# Patient Record
Sex: Female | Born: 1950
Health system: Southern US, Community
[De-identification: ages and names within clinical notes are randomized; demographics above are authoritative.]

## PROBLEM LIST (undated history)

## (undated) DIAGNOSIS — H409 Unspecified glaucoma: Secondary | ICD-10-CM

## (undated) DIAGNOSIS — Z889 Allergy status to unspecified drugs, medicaments and biological substances status: Secondary | ICD-10-CM

## (undated) DIAGNOSIS — Z87442 Personal history of urinary calculi: Secondary | ICD-10-CM

## (undated) DIAGNOSIS — F419 Anxiety disorder, unspecified: Secondary | ICD-10-CM

## (undated) DIAGNOSIS — R51 Headache: Secondary | ICD-10-CM

## (undated) DIAGNOSIS — I10 Essential (primary) hypertension: Secondary | ICD-10-CM

## (undated) HISTORY — PX: EYE SURGERY: SHX253

## (undated) HISTORY — PX: CYSTOSCOPY W/ URETERAL STENT PLACEMENT: SHX1429

## (undated) HISTORY — PX: APPENDECTOMY: SHX54

## (undated) HISTORY — PX: DILATION AND CURETTAGE OF UTERUS: SHX78

---

## 1999-01-04 ENCOUNTER — Other Ambulatory Visit: Admission: RE | Admit: 1999-01-04 | Discharge: 1999-01-04 | Payer: Self-pay | Admitting: *Deleted

## 1999-05-03 ENCOUNTER — Other Ambulatory Visit: Admission: RE | Admit: 1999-05-03 | Discharge: 1999-05-03 | Payer: Self-pay | Admitting: *Deleted

## 1999-05-06 ENCOUNTER — Encounter (INDEPENDENT_AMBULATORY_CARE_PROVIDER_SITE_OTHER): Payer: Self-pay | Admitting: *Deleted

## 1999-05-06 ENCOUNTER — Other Ambulatory Visit: Admission: RE | Admit: 1999-05-06 | Discharge: 1999-05-06 | Payer: Self-pay | Admitting: *Deleted

## 1999-11-18 ENCOUNTER — Other Ambulatory Visit: Admission: RE | Admit: 1999-11-18 | Discharge: 1999-11-18 | Payer: Self-pay | Admitting: *Deleted

## 2000-04-08 ENCOUNTER — Other Ambulatory Visit: Admission: RE | Admit: 2000-04-08 | Discharge: 2000-04-08 | Payer: Self-pay | Admitting: *Deleted

## 2001-01-01 ENCOUNTER — Other Ambulatory Visit: Admission: RE | Admit: 2001-01-01 | Discharge: 2001-01-01 | Payer: Self-pay | Admitting: *Deleted

## 2001-07-08 ENCOUNTER — Other Ambulatory Visit: Admission: RE | Admit: 2001-07-08 | Discharge: 2001-07-08 | Payer: Self-pay | Admitting: *Deleted

## 2001-08-31 ENCOUNTER — Ambulatory Visit (HOSPITAL_BASED_OUTPATIENT_CLINIC_OR_DEPARTMENT_OTHER): Admission: RE | Admit: 2001-08-31 | Discharge: 2001-08-31 | Payer: Self-pay | Admitting: Orthopedic Surgery

## 2001-11-16 ENCOUNTER — Ambulatory Visit (HOSPITAL_BASED_OUTPATIENT_CLINIC_OR_DEPARTMENT_OTHER): Admission: RE | Admit: 2001-11-16 | Discharge: 2001-11-16 | Payer: Self-pay | Admitting: Orthopedic Surgery

## 2001-12-29 ENCOUNTER — Other Ambulatory Visit: Admission: RE | Admit: 2001-12-29 | Discharge: 2001-12-29 | Payer: Self-pay | Admitting: *Deleted

## 2003-01-04 ENCOUNTER — Other Ambulatory Visit: Admission: RE | Admit: 2003-01-04 | Discharge: 2003-01-04 | Payer: Self-pay | Admitting: *Deleted

## 2003-06-20 ENCOUNTER — Encounter: Payer: Self-pay | Admitting: Internal Medicine

## 2003-06-20 ENCOUNTER — Encounter: Admission: RE | Admit: 2003-06-20 | Discharge: 2003-06-20 | Payer: Self-pay | Admitting: Internal Medicine

## 2003-06-27 ENCOUNTER — Encounter: Payer: Self-pay | Admitting: Internal Medicine

## 2003-06-27 ENCOUNTER — Encounter: Admission: RE | Admit: 2003-06-27 | Discharge: 2003-06-27 | Payer: Self-pay | Admitting: Internal Medicine

## 2004-01-11 ENCOUNTER — Other Ambulatory Visit: Admission: RE | Admit: 2004-01-11 | Discharge: 2004-01-11 | Payer: Self-pay | Admitting: Family Medicine

## 2005-01-16 ENCOUNTER — Other Ambulatory Visit: Admission: RE | Admit: 2005-01-16 | Discharge: 2005-01-16 | Payer: Self-pay | Admitting: *Deleted

## 2005-01-17 ENCOUNTER — Ambulatory Visit (HOSPITAL_BASED_OUTPATIENT_CLINIC_OR_DEPARTMENT_OTHER): Admission: RE | Admit: 2005-01-17 | Discharge: 2005-01-17 | Payer: Self-pay | Admitting: Orthopedic Surgery

## 2005-01-17 ENCOUNTER — Ambulatory Visit (HOSPITAL_COMMUNITY): Admission: RE | Admit: 2005-01-17 | Discharge: 2005-01-17 | Payer: Self-pay | Admitting: Orthopedic Surgery

## 2007-09-28 ENCOUNTER — Other Ambulatory Visit: Admission: RE | Admit: 2007-09-28 | Discharge: 2007-09-28 | Payer: Self-pay | Admitting: Obstetrics and Gynecology

## 2010-05-23 ENCOUNTER — Other Ambulatory Visit: Admission: RE | Admit: 2010-05-23 | Discharge: 2010-05-23 | Payer: Self-pay | Admitting: Obstetrics and Gynecology

## 2011-03-14 NOTE — Op Note (Signed)
Tara Bowman, Tara Bowman                ACCOUNT NO.:  [id number]   MEDICAL RECORD NO.:  [id number]          PATIENT TYPE:  AMB   LOCATION:  DSC                          FACILITY:  [facility]   PHYSICIAN:  Cindee Salt, M.D.       DATE OF BIRTH:  03-Jul-1951   DATE OF PROCEDURE:  01/17/2005  DATE OF DISCHARGE:                                 OPERATIVE REPORT   PREOPERATIVE DIAGNOSIS:  Carpal tunnel syndrome, left hand.   POSTOPERATIVE DIAGNOSIS:  Carpal tunnel syndrome, left hand.   OPERATION:  Decompression of left median nerve.   SURGEON:  Cindee Salt, M.D.   ASSISTANT:  Carolyne Fiscal R.N.   ANESTHESIA:  Forearm-based IV regional.   HISTORY:  The patient is a 60 year old female with a history of carpal  tunnel syndrome, EMG and nerve conductions positive, which has not responded  to conservative treatment.   PROCEDURE:  The patient was brought to the operating room where a forearm-  based IV regional anesthetic was carried out without difficulty.  She was  prepped using DuraPrep, supine position, left arm free.  A longitudinal  incision was made in the palm and carried down through subcutaneous tissue.  Bleeders were electrocauterized.  Palmar fascia was split, superficial  palmar arch identified, the flexor tendons to the ring and little finger  were identified.  To the ulnar side of median nerve, the carpal retinaculum  was incised with sharp dissection.  Right-angle and Sewell retractors were  placed between skin and forearm fascia.  The fascia was released for  approximately a centimeter proximal to the wrist crease under direct vision.  The canal was explored and no further lesions were identified.  The area of  compression to the nerve was apparent.  The wound was irrigated.  Skin was  closed with interrupted 5-0 nylon sutures.  A sterile compressive dressing  and splint were applied.  The patient tolerated the procedure well and was  taken to the recovery for observation in satisfactory  condition.   She is discharged home to return to the [facility] [place] in 1 week  on Vicodin and elevation,      GK/MEDQ  D:  01/17/2005  T:  01/18/2005  Job:  [id number]

## 2011-03-14 NOTE — Op Note (Signed)
[facility]  Patient:    Tara Bowman, Tara Bowman Visit Number: [id number] MRN: [id number]          Service Type: DSU Location: [facility] Attending Physician:  Ronne Binning Dictated by:   Nicki Reaper, M.D. Proc. Date: 11/16/01 Admit Date:  11/16/2001                             Operative Report  PREOPERATIVE DIAGNOSIS:  Carpal tunnel syndrome, right hand.  POSTOPERATIVE DIAGNOSIS:  Carpal tunnel syndrome, right hand.  PROCEDURE:  Decompression of right median nerve.  SURGEON:  Nicki Reaper, M.D.  ANESTHESIA:  Forearm-based IV regional.  ANESTHESIOLOGIST:  Maren Beach, M.D.  HISTORY:  The patient is a 60 year old female with a history of carpal tunnel syndrome, EMG/nerve conduction positive, which has not responded to conservative treatment.  DESCRIPTION OF PROCEDURE:  The patient was brought to the operating room, where a forearm-based IV regional anesthetic was carried out without difficulty.  She was prepped and draped using Betadine scrub and solution with the right arm free, and a longitudinal incision was made in the palm, carried down through subcutaneous tissue.  Bleeders were electrocauterzied.  Palmar fascia was split, the superficial palmar arch identified, the flexor tendons to the ring and little finger identified to the ulnar side of the median nerve.  The carpal retinaculum was incised with sharp dissection.  A right angle and Sewell retractor were placed between skin and forearm fascia.  The fascia was released for approximatley 3 cm proximal to the wrist crease under direct vision.  The canal was explored, and no further lesions were identified.  The wound was irrigated and the skin was closed with interrupted 5-0 nylon suture.  A sterile compressive dressing and splint were applied. The patient tolerated the procedure well and was taken to the recovery room for observation in satisfactory condition.  She is discharged  home, to return to the [facility] [place] in one week, on Vicodin and Keflex. Dictated by:   Nicki Reaper, M.D. Attending Physician:  Ronne Binning DD:  11/16/01 TD:  11/17/01 Job: [id number]

## 2011-05-14 ENCOUNTER — Other Ambulatory Visit: Payer: Self-pay | Admitting: Otolaryngology

## 2011-05-14 ENCOUNTER — Ambulatory Visit
Admission: RE | Admit: 2011-05-14 | Discharge: 2011-05-14 | Disposition: A | Discharge status: Home / Self Care | Payer: 59 | Source: Ambulatory Visit | Attending: Otolaryngology | Admitting: Otolaryngology

## 2012-08-05 IMAGING — CT CT PARANASAL SINUSES LIMITED
1 series · 8 of 10 positions shown, 10 images · non-contrast
Comparison: None.

CLINICAL DATA: Chronic sinusitis with facial pressure for 6 months.

CT PARANASAL SINUSES WITHOUT CONTRAST
TECHNIQUE: Multidetector CT through the paranasal sinuses was
performed using the standard protocol without intravenous contrast.

[Series 3: cor soft · axial · 0.35mm/px · z∈[+38,+108]mm · 8 of 10 slices shown, 10 images]
[im 2/10  brain]
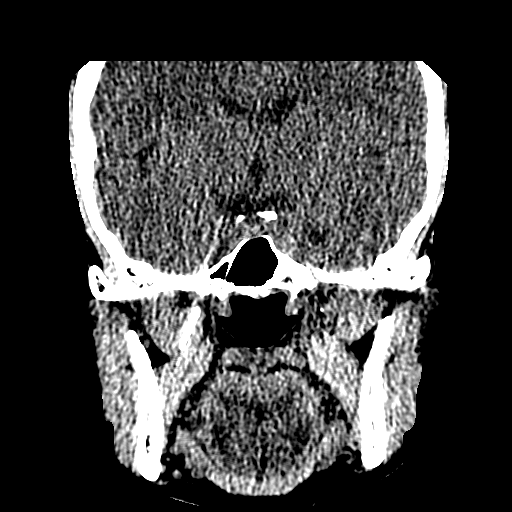
[im 2/10  bone]
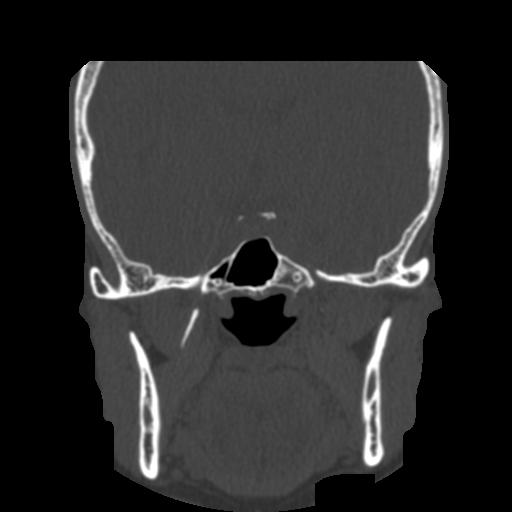
[im 3/10  bone]
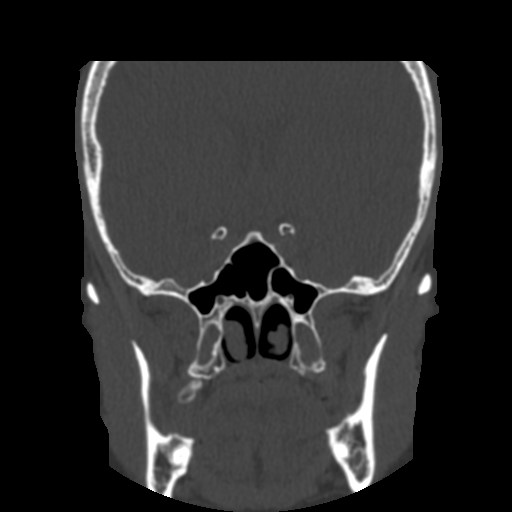
[im 4/10  bone]
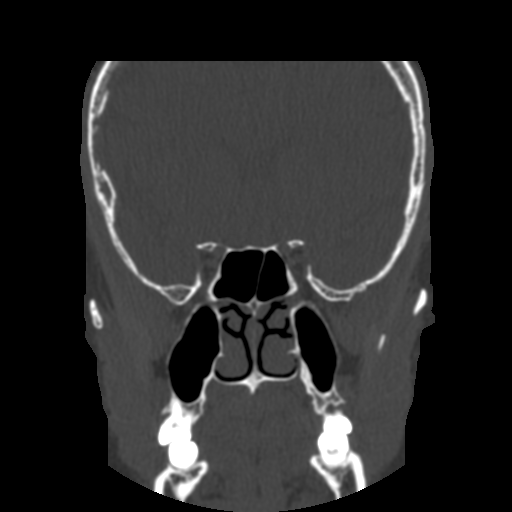
[im 5/10  bone]
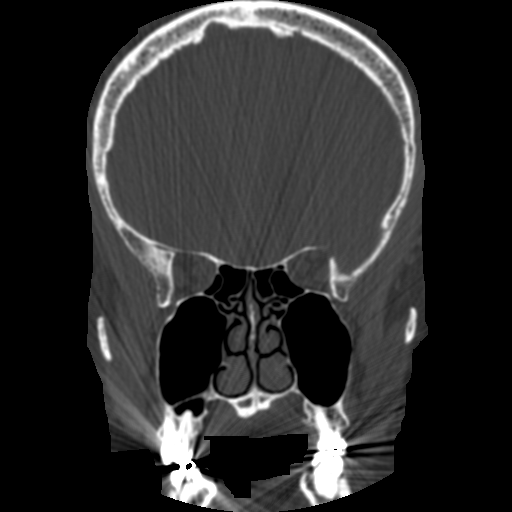
[im 6/10  brain]
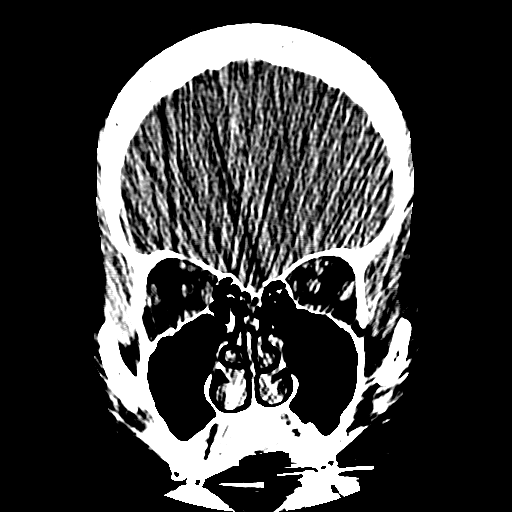
[im 6/10  bone]
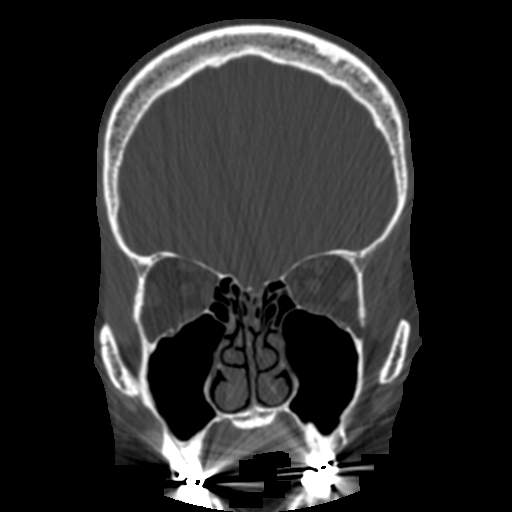
[im 7/10  bone]
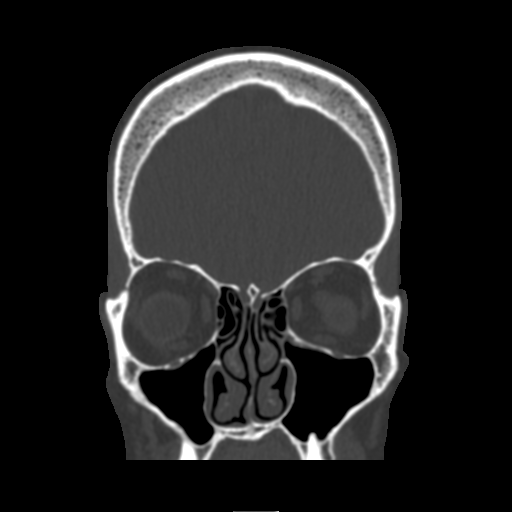
[im 8/10  bone]
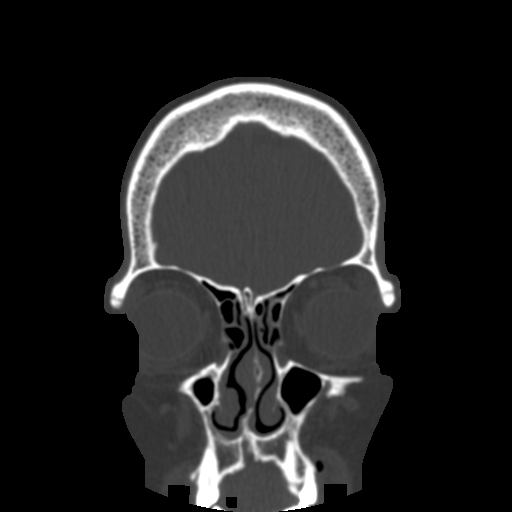
[im 9/10  bone]
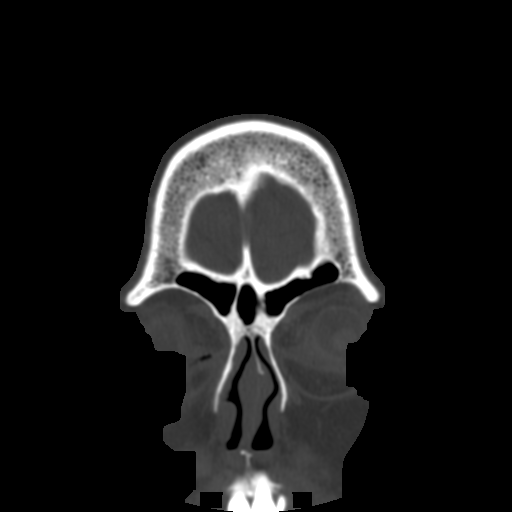

[8 of 10 positions shown; findings below may reference images not displayed]

FINDINGS: The paranasal sinuses are clear.  There is no air-fluid
level or mucosal thickening.  There is mild nasal septal deviation
left to right.  No significant turbinate hypertrophy.  Visualized
intracranial compartment unremarkable
IMPRESSION: Negative for acute or chronic sinusitis

## 2013-10-06 ENCOUNTER — Other Ambulatory Visit: Payer: Self-pay | Admitting: Obstetrics & Gynecology

## 2013-10-06 ENCOUNTER — Other Ambulatory Visit (HOSPITAL_COMMUNITY)
Admission: RE | Admit: 2013-10-06 | Discharge: 2013-10-06 | Disposition: A | Discharge status: Home / Self Care | Payer: 59 | Source: Ambulatory Visit | Attending: Obstetrics & Gynecology | Admitting: Obstetrics & Gynecology

## 2013-10-06 DIAGNOSIS — Z1151 Encounter for screening for human papillomavirus (HPV): Secondary | ICD-10-CM | POA: Insufficient documentation

## 2013-10-06 DIAGNOSIS — Z01419 Encounter for gynecological examination (general) (routine) without abnormal findings: Secondary | ICD-10-CM | POA: Insufficient documentation

## 2014-06-28 ENCOUNTER — Other Ambulatory Visit (HOSPITAL_COMMUNITY): Payer: Self-pay | Admitting: Internal Medicine

## 2014-06-28 DIAGNOSIS — R109 Unspecified abdominal pain: Secondary | ICD-10-CM

## 2014-07-07 ENCOUNTER — Other Ambulatory Visit: Payer: Self-pay | Admitting: Gastroenterology

## 2014-07-10 ENCOUNTER — Encounter (HOSPITAL_COMMUNITY): Payer: Self-pay | Admitting: *Deleted

## 2014-07-10 NOTE — Addendum Note (Signed)
Addended by: Cory Kitt on: 07/10/2014 05:58 AM   Modules accepted: Orders  

## 2014-07-11 ENCOUNTER — Encounter (HOSPITAL_COMMUNITY): Payer: Self-pay | Admitting: Pharmacy Technician

## 2014-07-12 ENCOUNTER — Encounter (HOSPITAL_COMMUNITY)
Admission: RE | Disposition: A | Discharge status: Home / Self Care | Payer: Self-pay | Source: Ambulatory Visit | Attending: Gastroenterology

## 2014-07-12 ENCOUNTER — Ambulatory Visit (HOSPITAL_COMMUNITY): Payer: 59 | Admitting: Anesthesiology

## 2014-07-12 ENCOUNTER — Ambulatory Visit (HOSPITAL_COMMUNITY)
Admission: RE | Admit: 2014-07-12 | Discharge: 2014-07-12 | Disposition: A | Discharge status: Home / Self Care | Payer: 59 | Source: Ambulatory Visit | Attending: Gastroenterology | Admitting: Gastroenterology

## 2014-07-12 ENCOUNTER — Encounter (HOSPITAL_COMMUNITY): Payer: Self-pay

## 2014-07-12 ENCOUNTER — Encounter (HOSPITAL_COMMUNITY): Payer: 59 | Admitting: Anesthesiology

## 2014-07-12 DIAGNOSIS — R1011 Right upper quadrant pain: Secondary | ICD-10-CM | POA: Diagnosis not present

## 2014-07-12 DIAGNOSIS — Z538 Procedure and treatment not carried out for other reasons: Secondary | ICD-10-CM | POA: Insufficient documentation

## 2014-07-12 DIAGNOSIS — Z87891 Personal history of nicotine dependence: Secondary | ICD-10-CM | POA: Diagnosis not present

## 2014-07-12 DIAGNOSIS — I10 Essential (primary) hypertension: Secondary | ICD-10-CM | POA: Diagnosis not present

## 2014-07-12 DIAGNOSIS — F411 Generalized anxiety disorder: Secondary | ICD-10-CM | POA: Insufficient documentation

## 2014-07-12 HISTORY — DX: Headache: R51

## 2014-07-12 HISTORY — DX: Personal history of urinary calculi: Z87.442

## 2014-07-12 HISTORY — DX: Allergy status to unspecified drugs, medicaments and biological substances: Z88.9

## 2014-07-12 HISTORY — DX: Anxiety disorder, unspecified: F41.9

## 2014-07-12 HISTORY — DX: Essential (primary) hypertension: I10

## 2014-07-12 HISTORY — DX: Unspecified glaucoma: H40.9

## 2014-07-12 SURGERY — CANCELLED PROCEDURE
Anesthesia: General

## 2014-07-12 MED ORDER — SODIUM CHLORIDE 0.9 % IV SOLN: INTRAVENOUS | Status: DC

## 2014-07-12 MED ORDER — CIPROFLOXACIN IN D5W 400 MG/200ML IV SOLN
INTRAVENOUS | Status: AC
  Filled 2014-07-12: qty 200

## 2014-07-12 MED ORDER — PROPOFOL 10 MG/ML IV BOLUS
INTRAVENOUS | Status: AC
  Filled 2014-07-12: qty 20

## 2014-07-12 MED ORDER — BUTAMBEN-TETRACAINE-BENZOCAINE 2-2-14 % EX AERO
INHALATION_SPRAY | CUTANEOUS | Status: DC | PRN
  Administered 2014-07-12: 1 via TOPICAL

## 2014-07-12 MED ORDER — LACTATED RINGERS IV SOLN
INTRAVENOUS | Status: DC
  Administered 2014-07-12: 11:00:00 via INTRAVENOUS

## 2014-07-12 NOTE — Transfer of Care (Signed)
Immediate Anesthesia Transfer of Care Note  Patient: Tara Bowman  Procedure(s) Performed: Procedure(s): CANCELLED PROCEDURE  Patient Location: PACU  Anesthesia Type:Case cancelled after entry into procedure suite but BEFORE administration of any medications  Level of Consciousness: awake, alert  and oriented  Airway & Oxygen Therapy: Patient Spontanous Breathing  Post-op Assessment: Report given to PACU RN and Post -op Vital signs reviewed and stable  Post vital signs: Reviewed and stable  Complications: No apparent anesthesia complications

## 2014-07-12 NOTE — Anesthesia Postprocedure Evaluation (Signed)
  Anesthesia Post-op Note  Patient: Tara Bowman  Procedure(s) Performed: * No procedures listed *  Patient Location: PACU  Anesthesia Type: none  Level of Consciousness: awake and alert   Airway and Oxygen Therapy: Patient Spontanous Breathing  Post-op Pain: mild  Post-op Assessment: Post-op Vital signs reviewed, Patient's Cardiovascular Status Stable, Respiratory Function Stable, Patent Airway and No signs of Nausea or vomiting  Last Vitals:  Filed Vitals:   07/12/14 1053  BP: 134/74  Pulse: 65  Temp: 36.9 C  Resp: 12    Post-op Vital Signs: stable   Complications: No apparent anesthesia complications. Patient self-cancelled procedure prior to any medicines being given.

## 2014-07-12 NOTE — H&P (Signed)
Patient interval history reviewed.  Patient examined again.  There has been no change from documented H/P dated 07/06/14 (scanned into chart from our office) except as documented above.  Assessment:  1.  Abdominal pain. 2.  Gallstone(s). 3.  Possible bile duct stone on transabdominal ultrasound.  Normal LFTs.  Normal amylase, lipase.  Plan:  1.  Endoscopic ultrasound. 2.  Risks (bleeding, infection, bowel perforation that could require surgery, sedation-related changes in cardiopulmonary systems), benefits (identification and possible treatment of source of symptoms, exclusion of certain causes of symptoms), and alternatives (watchful waiting, radiographic imaging studies, empiric medical treatment) of upper endoscopy with ultrasound and possible biopsies (EUS +/- FNA) were explained to patient/family in detail and patient wishes to proceed. 3.  If choledocholithiasis is seen on endoscopic ultrasound, would proceed with same-sedation ERCP for possible biliary sphincterotomy and bile duct stone extraction. 4.  Risks (up to and including bleeding, infection, perforation, pancreatitis that can be complicated by infected necrosis and death), benefits (removal of stones, alleviating blockage, decreasing risk of cholangitis or choledocholithiasis-related pancreatitis), and alternatives (watchful waiting, percutaneous transhepatic cholangiography) of ERCP were explained to patient/family in detail and patient elects to proceed.

## 2014-07-12 NOTE — Anesthesia Preprocedure Evaluation (Addendum)
Anesthesia Evaluation  Patient identified by MRN, date of birth, ID band Patient awake    Reviewed: Allergy & Precautions, H&P , NPO status , Patient's Chart, lab work & pertinent test results  Airway Mallampati: II TM Distance: >3 FB Neck ROM: Full    Dental no notable dental hx.    Pulmonary neg pulmonary ROS, former smoker,  breath sounds clear to auscultation  Pulmonary exam normal       Cardiovascular Exercise Tolerance: Good hypertension, Pt. on medications and Pt. on home beta blockers Rhythm:Regular Rate:Normal     Neuro/Psych  Headaches, Anxiety    GI/Hepatic negative GI ROS, Neg liver ROS,   Endo/Other  negative endocrine ROS  Renal/GU negative Renal ROS  negative genitourinary   Musculoskeletal negative musculoskeletal ROS (+)   Abdominal   Peds negative pediatric ROS (+)  Hematology negative hematology ROS (+)   Anesthesia Other Findings   Reproductive/Obstetrics negative OB ROS                          Anesthesia Physical Anesthesia Plan  ASA: II  Anesthesia Plan: General and MAC   Post-op Pain Management:    Induction: Intravenous  Airway Management Planned:   Additional Equipment:   Intra-op Plan:   Post-operative Plan: Extubation in OR  Informed Consent: I have reviewed the patients History and Physical, chart, labs and discussed the procedure including the risks, benefits and alternatives for the proposed anesthesia with the patient or authorized representative who has indicated his/her understanding and acceptance.   Dental advisory given  Plan Discussed with: CRNA  Anesthesia Plan Comments: (MAC to start with GA backup if ERCP necessary.)       Anesthesia Quick Evaluation

## 2014-11-09 ENCOUNTER — Encounter (HOSPITAL_COMMUNITY): Payer: Self-pay | Admitting: Gastroenterology

## 2015-02-13 DIAGNOSIS — Z0000 Encounter for general adult medical examination without abnormal findings: Secondary | ICD-10-CM | POA: Diagnosis not present

## 2015-02-13 DIAGNOSIS — J302 Other seasonal allergic rhinitis: Secondary | ICD-10-CM | POA: Diagnosis not present

## 2015-02-13 DIAGNOSIS — Z1389 Encounter for screening for other disorder: Secondary | ICD-10-CM | POA: Diagnosis not present

## 2015-02-13 DIAGNOSIS — Z23 Encounter for immunization: Secondary | ICD-10-CM | POA: Diagnosis not present

## 2015-02-13 DIAGNOSIS — I10 Essential (primary) hypertension: Secondary | ICD-10-CM | POA: Diagnosis not present

## 2015-02-13 DIAGNOSIS — F325 Major depressive disorder, single episode, in full remission: Secondary | ICD-10-CM | POA: Diagnosis not present

## 2015-02-27 DIAGNOSIS — H2513 Age-related nuclear cataract, bilateral: Secondary | ICD-10-CM | POA: Diagnosis not present

## 2015-05-01 DIAGNOSIS — R197 Diarrhea, unspecified: Secondary | ICD-10-CM | POA: Diagnosis not present

## 2015-05-01 DIAGNOSIS — R194 Change in bowel habit: Secondary | ICD-10-CM | POA: Diagnosis not present

## 2015-07-09 DIAGNOSIS — R1011 Right upper quadrant pain: Secondary | ICD-10-CM | POA: Diagnosis not present

## 2015-08-26 DIAGNOSIS — J0100 Acute maxillary sinusitis, unspecified: Secondary | ICD-10-CM | POA: Diagnosis not present

## 2015-09-08 DIAGNOSIS — J0100 Acute maxillary sinusitis, unspecified: Secondary | ICD-10-CM | POA: Diagnosis not present

## 2016-01-10 DIAGNOSIS — H25813 Combined forms of age-related cataract, bilateral: Secondary | ICD-10-CM | POA: Diagnosis not present

## 2016-02-15 DIAGNOSIS — J301 Allergic rhinitis due to pollen: Secondary | ICD-10-CM | POA: Diagnosis not present

## 2016-02-15 DIAGNOSIS — J0100 Acute maxillary sinusitis, unspecified: Secondary | ICD-10-CM | POA: Diagnosis not present

## 2016-02-25 DIAGNOSIS — F325 Major depressive disorder, single episode, in full remission: Secondary | ICD-10-CM | POA: Diagnosis not present

## 2016-02-25 DIAGNOSIS — Z136 Encounter for screening for cardiovascular disorders: Secondary | ICD-10-CM | POA: Diagnosis not present

## 2016-02-25 DIAGNOSIS — Z0000 Encounter for general adult medical examination without abnormal findings: Secondary | ICD-10-CM | POA: Diagnosis not present

## 2016-02-25 DIAGNOSIS — R6889 Other general symptoms and signs: Secondary | ICD-10-CM | POA: Diagnosis not present

## 2016-02-25 DIAGNOSIS — I10 Essential (primary) hypertension: Secondary | ICD-10-CM | POA: Diagnosis not present

## 2016-02-25 DIAGNOSIS — R5383 Other fatigue: Secondary | ICD-10-CM | POA: Diagnosis not present

## 2016-02-25 DIAGNOSIS — Z1389 Encounter for screening for other disorder: Secondary | ICD-10-CM | POA: Diagnosis not present

## 2016-05-08 DIAGNOSIS — L259 Unspecified contact dermatitis, unspecified cause: Secondary | ICD-10-CM | POA: Diagnosis not present

## 2016-05-29 DIAGNOSIS — L281 Prurigo nodularis: Secondary | ICD-10-CM | POA: Diagnosis not present

## 2016-05-29 DIAGNOSIS — L538 Other specified erythematous conditions: Secondary | ICD-10-CM | POA: Diagnosis not present

## 2016-05-29 DIAGNOSIS — L309 Dermatitis, unspecified: Secondary | ICD-10-CM | POA: Diagnosis not present

## 2016-05-29 DIAGNOSIS — L820 Inflamed seborrheic keratosis: Secondary | ICD-10-CM | POA: Diagnosis not present

## 2016-05-29 DIAGNOSIS — L821 Other seborrheic keratosis: Secondary | ICD-10-CM | POA: Diagnosis not present

## 2016-07-10 DIAGNOSIS — H2513 Age-related nuclear cataract, bilateral: Secondary | ICD-10-CM | POA: Diagnosis not present

## 2017-03-14 DIAGNOSIS — J069 Acute upper respiratory infection, unspecified: Secondary | ICD-10-CM | POA: Diagnosis not present

## 2017-03-14 DIAGNOSIS — R5383 Other fatigue: Secondary | ICD-10-CM | POA: Diagnosis not present

## 2017-03-14 DIAGNOSIS — J200 Acute bronchitis due to Mycoplasma pneumoniae: Secondary | ICD-10-CM | POA: Diagnosis not present

## 2017-03-14 DIAGNOSIS — H6691 Otitis media, unspecified, right ear: Secondary | ICD-10-CM | POA: Diagnosis not present

## 2017-03-19 DIAGNOSIS — J209 Acute bronchitis, unspecified: Secondary | ICD-10-CM | POA: Diagnosis not present

## 2017-03-19 DIAGNOSIS — B370 Candidal stomatitis: Secondary | ICD-10-CM | POA: Diagnosis not present

## 2017-03-19 DIAGNOSIS — R05 Cough: Secondary | ICD-10-CM | POA: Diagnosis not present

## 2017-03-24 DIAGNOSIS — J209 Acute bronchitis, unspecified: Secondary | ICD-10-CM | POA: Diagnosis not present

## 2017-04-04 DIAGNOSIS — J019 Acute sinusitis, unspecified: Secondary | ICD-10-CM | POA: Diagnosis not present

## 2017-06-04 DIAGNOSIS — Z0000 Encounter for general adult medical examination without abnormal findings: Secondary | ICD-10-CM | POA: Diagnosis not present

## 2017-06-04 DIAGNOSIS — Z23 Encounter for immunization: Secondary | ICD-10-CM | POA: Diagnosis not present

## 2017-06-04 DIAGNOSIS — G43909 Migraine, unspecified, not intractable, without status migrainosus: Secondary | ICD-10-CM | POA: Diagnosis not present

## 2017-06-04 DIAGNOSIS — J329 Chronic sinusitis, unspecified: Secondary | ICD-10-CM | POA: Diagnosis not present

## 2017-06-04 DIAGNOSIS — I10 Essential (primary) hypertension: Secondary | ICD-10-CM | POA: Diagnosis not present

## 2017-06-04 DIAGNOSIS — F325 Major depressive disorder, single episode, in full remission: Secondary | ICD-10-CM | POA: Diagnosis not present

## 2017-06-04 DIAGNOSIS — F410 Panic disorder [episodic paroxysmal anxiety] without agoraphobia: Secondary | ICD-10-CM | POA: Diagnosis not present

## 2017-06-04 DIAGNOSIS — M8580 Other specified disorders of bone density and structure, unspecified site: Secondary | ICD-10-CM | POA: Diagnosis not present

## 2017-06-24 DIAGNOSIS — H25813 Combined forms of age-related cataract, bilateral: Secondary | ICD-10-CM | POA: Diagnosis not present

## 2017-07-19 DIAGNOSIS — J019 Acute sinusitis, unspecified: Secondary | ICD-10-CM | POA: Diagnosis not present

## 2017-08-04 DIAGNOSIS — H25812 Combined forms of age-related cataract, left eye: Secondary | ICD-10-CM | POA: Diagnosis not present

## 2017-08-04 DIAGNOSIS — Z01818 Encounter for other preprocedural examination: Secondary | ICD-10-CM | POA: Diagnosis not present

## 2017-08-04 DIAGNOSIS — H25811 Combined forms of age-related cataract, right eye: Secondary | ICD-10-CM | POA: Diagnosis not present

## 2017-08-13 DIAGNOSIS — H2511 Age-related nuclear cataract, right eye: Secondary | ICD-10-CM | POA: Diagnosis not present

## 2017-08-13 DIAGNOSIS — H25811 Combined forms of age-related cataract, right eye: Secondary | ICD-10-CM | POA: Diagnosis not present

## 2017-08-13 DIAGNOSIS — Z87891 Personal history of nicotine dependence: Secondary | ICD-10-CM | POA: Diagnosis not present

## 2017-08-13 DIAGNOSIS — I10 Essential (primary) hypertension: Secondary | ICD-10-CM | POA: Diagnosis not present

## 2017-08-27 DIAGNOSIS — I10 Essential (primary) hypertension: Secondary | ICD-10-CM | POA: Diagnosis not present

## 2017-08-27 DIAGNOSIS — Z87891 Personal history of nicotine dependence: Secondary | ICD-10-CM | POA: Diagnosis not present

## 2017-08-27 DIAGNOSIS — H25812 Combined forms of age-related cataract, left eye: Secondary | ICD-10-CM | POA: Diagnosis not present

## 2017-08-27 DIAGNOSIS — H2512 Age-related nuclear cataract, left eye: Secondary | ICD-10-CM | POA: Diagnosis not present

## 2017-09-21 DIAGNOSIS — J019 Acute sinusitis, unspecified: Secondary | ICD-10-CM | POA: Diagnosis not present

## 2017-09-30 DIAGNOSIS — H9193 Unspecified hearing loss, bilateral: Secondary | ICD-10-CM | POA: Diagnosis not present

## 2017-09-30 DIAGNOSIS — J342 Deviated nasal septum: Secondary | ICD-10-CM | POA: Diagnosis not present

## 2017-09-30 DIAGNOSIS — J301 Allergic rhinitis due to pollen: Secondary | ICD-10-CM | POA: Diagnosis not present

## 2017-09-30 DIAGNOSIS — H9201 Otalgia, right ear: Secondary | ICD-10-CM | POA: Diagnosis not present

## 2017-09-30 DIAGNOSIS — J320 Chronic maxillary sinusitis: Secondary | ICD-10-CM | POA: Diagnosis not present

## 2017-09-30 DIAGNOSIS — J343 Hypertrophy of nasal turbinates: Secondary | ICD-10-CM | POA: Diagnosis not present

## 2017-09-30 DIAGNOSIS — J309 Allergic rhinitis, unspecified: Secondary | ICD-10-CM | POA: Diagnosis not present

## 2017-10-14 DIAGNOSIS — J301 Allergic rhinitis due to pollen: Secondary | ICD-10-CM | POA: Diagnosis not present

## 2017-10-14 DIAGNOSIS — J342 Deviated nasal septum: Secondary | ICD-10-CM | POA: Diagnosis not present

## 2017-10-14 DIAGNOSIS — J3489 Other specified disorders of nose and nasal sinuses: Secondary | ICD-10-CM | POA: Diagnosis not present

## 2018-07-05 ENCOUNTER — Other Ambulatory Visit: Payer: Self-pay | Admitting: Family Medicine
